# Patient Record
Sex: Female | Born: 1955 | Race: Black or African American | Hispanic: No | Smoking: Never smoker
Health system: Southern US, Community
[De-identification: ages and names within clinical notes are randomized; demographics above are authoritative.]

## PROBLEM LIST (undated history)

## (undated) DIAGNOSIS — J309 Allergic rhinitis, unspecified: Secondary | ICD-10-CM

## (undated) DIAGNOSIS — Z9109 Other allergy status, other than to drugs and biological substances: Secondary | ICD-10-CM

## (undated) DIAGNOSIS — K219 Gastro-esophageal reflux disease without esophagitis: Secondary | ICD-10-CM

## (undated) HISTORY — DX: Allergic rhinitis, unspecified: J30.9

## (undated) HISTORY — PX: ABDOMINAL SURGERY: SHX537

## (undated) HISTORY — PX: DILATION AND CURETTAGE OF UTERUS: SHX78

## (undated) HISTORY — PX: HEMORROIDECTOMY: SUR656

## (undated) HISTORY — DX: Gastro-esophageal reflux disease without esophagitis: K21.9

---

## 1997-08-13 ENCOUNTER — Other Ambulatory Visit: Admission: RE | Admit: 1997-08-13 | Discharge: 1997-08-13 | Payer: Self-pay | Admitting: Gynecology

## 1997-12-25 ENCOUNTER — Other Ambulatory Visit: Admission: RE | Admit: 1997-12-25 | Discharge: 1997-12-25 | Payer: Self-pay | Admitting: Obstetrics and Gynecology

## 1998-02-17 ENCOUNTER — Ambulatory Visit (HOSPITAL_COMMUNITY): Admission: RE | Admit: 1998-02-17 | Discharge: 1998-02-17 | Payer: Self-pay | Admitting: Obstetrics and Gynecology

## 1998-04-27 ENCOUNTER — Ambulatory Visit (HOSPITAL_COMMUNITY): Admission: RE | Admit: 1998-04-27 | Discharge: 1998-04-27 | Payer: Self-pay | Admitting: Obstetrics and Gynecology

## 1998-04-30 ENCOUNTER — Ambulatory Visit (HOSPITAL_COMMUNITY): Admission: RE | Admit: 1998-04-30 | Discharge: 1998-04-30 | Payer: Self-pay | Admitting: Obstetrics and Gynecology

## 1998-04-30 ENCOUNTER — Encounter: Payer: Self-pay | Admitting: Obstetrics and Gynecology

## 1998-05-01 ENCOUNTER — Encounter: Payer: Self-pay | Admitting: Obstetrics and Gynecology

## 1998-05-01 ENCOUNTER — Inpatient Hospital Stay (HOSPITAL_COMMUNITY): Admission: AD | Admit: 1998-05-01 | Discharge: 1998-05-13 | Payer: Self-pay | Admitting: Obstetrics and Gynecology

## 1998-05-07 ENCOUNTER — Encounter: Payer: Self-pay | Admitting: Obstetrics and Gynecology

## 1998-05-12 ENCOUNTER — Encounter (HOSPITAL_COMMUNITY): Admission: RE | Admit: 1998-05-12 | Discharge: 1998-08-10 | Payer: Self-pay | Admitting: Obstetrics and Gynecology

## 1998-08-13 ENCOUNTER — Encounter (HOSPITAL_COMMUNITY): Admission: RE | Admit: 1998-08-13 | Discharge: 1998-11-11 | Payer: Self-pay | Admitting: *Deleted

## 1999-03-02 ENCOUNTER — Other Ambulatory Visit: Admission: RE | Admit: 1999-03-02 | Discharge: 1999-03-02 | Payer: Self-pay | Admitting: Obstetrics and Gynecology

## 2000-02-28 ENCOUNTER — Encounter: Admission: RE | Admit: 2000-02-28 | Discharge: 2000-02-28 | Payer: Self-pay | Admitting: Gynecology

## 2000-02-28 ENCOUNTER — Encounter: Payer: Self-pay | Admitting: Gynecology

## 2000-09-13 ENCOUNTER — Other Ambulatory Visit: Admission: RE | Admit: 2000-09-13 | Discharge: 2000-09-13 | Payer: Self-pay | Admitting: Obstetrics and Gynecology

## 2000-09-18 ENCOUNTER — Encounter: Payer: Self-pay | Admitting: Obstetrics and Gynecology

## 2000-09-18 ENCOUNTER — Ambulatory Visit (HOSPITAL_COMMUNITY): Admission: RE | Admit: 2000-09-18 | Discharge: 2000-09-18 | Payer: Self-pay | Admitting: Obstetrics and Gynecology

## 2000-10-08 ENCOUNTER — Encounter (INDEPENDENT_AMBULATORY_CARE_PROVIDER_SITE_OTHER): Payer: Self-pay | Admitting: Specialist

## 2000-10-08 ENCOUNTER — Ambulatory Visit (HOSPITAL_COMMUNITY): Admission: RE | Admit: 2000-10-08 | Discharge: 2000-10-08 | Payer: Self-pay | Admitting: Obstetrics and Gynecology

## 2000-12-20 ENCOUNTER — Encounter: Admission: RE | Admit: 2000-12-20 | Discharge: 2000-12-20 | Payer: Self-pay | Admitting: Obstetrics and Gynecology

## 2000-12-20 ENCOUNTER — Encounter: Payer: Self-pay | Admitting: Obstetrics and Gynecology

## 2001-01-23 ENCOUNTER — Encounter: Admission: RE | Admit: 2001-01-23 | Discharge: 2001-01-23 | Payer: Self-pay | Admitting: Family Medicine

## 2001-01-23 ENCOUNTER — Encounter: Payer: Self-pay | Admitting: Family Medicine

## 2001-12-23 ENCOUNTER — Encounter: Admission: RE | Admit: 2001-12-23 | Discharge: 2001-12-23 | Payer: Self-pay | Admitting: Obstetrics and Gynecology

## 2001-12-23 ENCOUNTER — Encounter: Payer: Self-pay | Admitting: Obstetrics and Gynecology

## 2001-12-26 ENCOUNTER — Other Ambulatory Visit: Admission: RE | Admit: 2001-12-26 | Discharge: 2001-12-26 | Payer: Self-pay | Admitting: Obstetrics and Gynecology

## 2002-10-03 ENCOUNTER — Encounter: Admission: RE | Admit: 2002-10-03 | Discharge: 2002-11-24 | Payer: Self-pay | Admitting: Family Medicine

## 2002-10-30 ENCOUNTER — Encounter: Payer: Self-pay | Admitting: Family Medicine

## 2002-10-30 ENCOUNTER — Encounter: Admission: RE | Admit: 2002-10-30 | Discharge: 2002-10-30 | Payer: Self-pay | Admitting: Family Medicine

## 2002-12-25 ENCOUNTER — Encounter: Admission: RE | Admit: 2002-12-25 | Discharge: 2002-12-25 | Payer: Self-pay | Admitting: Obstetrics and Gynecology

## 2002-12-25 ENCOUNTER — Encounter: Payer: Self-pay | Admitting: Obstetrics and Gynecology

## 2002-12-29 ENCOUNTER — Other Ambulatory Visit: Admission: RE | Admit: 2002-12-29 | Discharge: 2002-12-29 | Payer: Self-pay | Admitting: Obstetrics and Gynecology

## 2004-01-01 ENCOUNTER — Encounter: Admission: RE | Admit: 2004-01-01 | Discharge: 2004-01-01 | Payer: Self-pay | Admitting: Obstetrics and Gynecology

## 2005-06-14 ENCOUNTER — Other Ambulatory Visit: Admission: RE | Admit: 2005-06-14 | Discharge: 2005-06-14 | Payer: Self-pay | Admitting: Obstetrics and Gynecology

## 2005-08-11 ENCOUNTER — Ambulatory Visit: Payer: Self-pay | Admitting: Family Medicine

## 2005-09-06 ENCOUNTER — Encounter: Admission: RE | Admit: 2005-09-06 | Discharge: 2005-09-06 | Payer: Self-pay | Admitting: Obstetrics and Gynecology

## 2007-03-13 DIAGNOSIS — J309 Allergic rhinitis, unspecified: Secondary | ICD-10-CM

## 2007-03-13 DIAGNOSIS — K219 Gastro-esophageal reflux disease without esophagitis: Secondary | ICD-10-CM

## 2007-03-13 HISTORY — DX: Allergic rhinitis, unspecified: J30.9

## 2007-03-13 HISTORY — DX: Gastro-esophageal reflux disease without esophagitis: K21.9

## 2008-06-12 ENCOUNTER — Encounter: Admission: RE | Admit: 2008-06-12 | Discharge: 2008-06-12 | Payer: Self-pay | Admitting: Obstetrics and Gynecology

## 2008-12-29 ENCOUNTER — Telehealth: Payer: Self-pay | Admitting: Family Medicine

## 2009-06-15 ENCOUNTER — Encounter: Admission: RE | Admit: 2009-06-15 | Discharge: 2009-06-15 | Payer: Self-pay | Admitting: Family Medicine

## 2009-11-10 ENCOUNTER — Ambulatory Visit: Payer: Self-pay | Admitting: Family Medicine

## 2009-11-10 LAB — CONVERTED CEMR LAB
Bilirubin Urine: NEGATIVE
Blood in Urine, dipstick: NEGATIVE
Ketones, urine, test strip: NEGATIVE
Protein, U semiquant: NEGATIVE
Urobilinogen, UA: 0.2

## 2009-11-12 ENCOUNTER — Encounter (INDEPENDENT_AMBULATORY_CARE_PROVIDER_SITE_OTHER): Payer: Self-pay | Admitting: *Deleted

## 2009-11-15 ENCOUNTER — Ambulatory Visit: Payer: Self-pay | Admitting: Internal Medicine

## 2009-11-16 LAB — CONVERTED CEMR LAB
BUN: 17 mg/dL (ref 6–23)
Bilirubin, Direct: 0.2 mg/dL (ref 0.0–0.3)
Chloride: 111 meq/L (ref 96–112)
Cholesterol: 159 mg/dL (ref 0–200)
Creatinine, Ser: 0.7 mg/dL (ref 0.4–1.2)
Eosinophils Absolute: 0.1 10*3/uL (ref 0.0–0.7)
Eosinophils Relative: 2 % (ref 0.0–5.0)
HDL: 70.6 mg/dL (ref >39.00)
LDL Cholesterol: 84 mg/dL (ref 0–99)
MCHC: 34.1 g/dL (ref 30.0–36.0)
MCV: 95.9 fL (ref 78.0–100.0)
Monocytes Absolute: 0.5 10*3/uL (ref 0.1–1.0)
Neutrophils Relative %: 55.6 % (ref 43.0–77.0)
Platelets: 202 10*3/uL (ref 150.0–400.0)
Total Bilirubin: 0.8 mg/dL (ref 0.3–1.2)
Triglycerides: 23 mg/dL (ref 0.0–149.0)
WBC: 4.4 10*3/uL — ABNORMAL LOW (ref 4.5–10.5)

## 2009-11-26 ENCOUNTER — Ambulatory Visit: Payer: Self-pay | Admitting: Internal Medicine

## 2009-11-26 LAB — HM COLONOSCOPY

## 2010-04-27 ENCOUNTER — Ambulatory Visit: Payer: Self-pay | Admitting: Family Medicine

## 2010-06-17 ENCOUNTER — Encounter
Admission: RE | Admit: 2010-06-17 | Discharge: 2010-06-17 | Payer: Self-pay | Source: Home / Self Care | Attending: Obstetrics and Gynecology | Admitting: Obstetrics and Gynecology

## 2010-06-17 LAB — HM MAMMOGRAPHY: HM Mammogram: NEGATIVE

## 2010-08-09 NOTE — Letter (Signed)
Summary: [facility] Instructions  [facility]  [address] [place], [place] [address]   Phone: [phone]  Fax: [phone]       Tabitha Cherry    February 21, 1956    MRN: [id number]       Procedure Day /Date: Friday 11/26/2009     Arrival Time:  9:30 am     Procedure Time: 10:30 am     Location of Procedure:                    _x _  [facility] (4th Floor)    PREPARATION FOR COLONOSCOPY WITH MIRALAX  Starting 5 days prior to your procedure Sunday 5/15 do not eat nuts, seeds, popcorn, corn, beans, peas,  salads, or any raw vegetables.  Do not take any fiber supplements (e.g. Metamucil, Citrucel, and Benefiber). ____________________________________________________________________________________________________   THE DAY BEFORE YOUR PROCEDURE         DATE: Thursday 5/19  1   Drink clear liquids the entire day-NO SOLID FOOD  2   Do not drink anything colored red or purple.  Avoid juices with pulp.  No orange juice.  3   Drink at least 64 oz. (8 glasses) of fluid/clear liquids during the day to prevent dehydration and help the prep work efficiently.  CLEAR LIQUIDS INCLUDE: Water Jello Ice Popsicles Tea (sugar ok, no milk/cream) Powdered fruit flavored drinks Coffee (sugar ok, no milk/cream) Gatorade Juice: apple, white grape, white cranberry  Lemonade Clear bullion, consomm, broth Carbonated beverages (any kind) Strained chicken noodle soup Hard Candy  4   Mix the entire bottle of Miralax with 64 oz. of Gatorade/Powerade in the morning and put in the refrigerator to chill.  5   At 3:00 pm take 2 Dulcolax/Bisacodyl tablets.  6   At 4:30 pm take one Reglan/Metoclopramide tablet.  7  Starting at 5:00 pm drink one 8 oz glass of the Miralax mixture every 15-20 minutes until you have finished drinking the entire 64 oz.  You should finish drinking prep around 7:30 or 8:00 pm.  8   If you are nauseated, you may take the 2nd  Reglan/Metoclopramide tablet at 6:30 pm.        9    At 8:00 pm take 2 more DULCOLAX/Bisacodyl tablets.     THE DAY OF YOUR PROCEDURE      DATE:  Friday 5/20  You may drink clear liquids until 8:30 am  (2 HOURS BEFORE PROCEDURE).   MEDICATION INSTRUCTIONS  Unless otherwise instructed, you should take regular prescription medications with a small sip of water as early as possible the morning of your procedure.           OTHER INSTRUCTIONS  You will need a responsible adult at least 54 years of age to accompany you and drive you home.   This person must remain in the waiting room during your procedure.  Wear loose fitting clothing that is easily removed.  Leave jewelry and other valuables at home.  However, you may wish to bring a book to read or an iPod/MP3 player to listen to music as you wait for your procedure to start.  Remove all body piercing jewelry and leave at home.  Total time from sign-in until discharge is approximately 2-3 hours.  You should go home directly after your procedure and rest.  You can resume normal activities the day after your procedure.  The day of your procedure you should not:   Drive  Make legal decisions   Operate machinery   Drink alcohol   Return to work  You will receive specific instructions about eating, activities and medications before you leave.   The above instructions have been reviewed and explained to me by   Ezra Sites RN  Nov 15, 2009 4:26 PM     I fully understand and can verbalize these instructions _____________________________ Date _______

## 2010-08-09 NOTE — Procedures (Signed)
Summary: Colonoscopy  Patient: [name] Note: All result statuses are Final unless otherwise noted.  Tests: (1) Colonoscopy (COL)   COL Colonoscopy           DONE     [facility]     [address]     [place], [place]  [address]           COLONOSCOPY PROCEDURE REPORT           PATIENT:  Tabitha Cherry, Tabitha Cherry  MR#:  [id number]     BIRTHDATE:  1956-05-24, 54 yrs. old  GENDER:  female     ENDOSCOPIST:  Hedwig Morton. Juanda Chance, MD     REF. BY:  Tinnie Gens A. Tawanna Cooler, M.D.     PROCEDURE DATE:  11/26/2009     PROCEDURE:  Colonoscopy 19147     ASA CLASS:  Class I     INDICATIONS:  Routine Risk Screening     MEDICATIONS:   Versed 10 mg, Fentanyl 100 mcg           DESCRIPTION OF PROCEDURE:   After the risks benefits and     alternatives of the procedure were thoroughly explained, informed     consent was obtained.  Digital rectal exam was performed and     revealed no rectal masses.   The [id number] endoscope was     introduced through the anus and advanced to the cecum, which was     identified by both the appendix and ileocecal valve, without     limitations.  The quality of the prep was good, using MiraLax.     The instrument was then slowly withdrawn as the colon was fully     examined.     <<PROCEDUREIMAGES>>     FINDINGS:  Internal hemorrhoids were found (see image7, image6,     and image5).  This was otherwise a normal examination of the colon     (see image4, image3, image2, and image1).   Retroflexed views in     the rectum revealed no abnormalities.    The scope was the     n withdrawn from the patient and the procedure completed.           COMPLICATIONS:  None     ENDOSCOPIC IMPRESSION:     1) Internal hemorrhoids     2) Otherwise normal examination     RECOMMENDATIONS:     1) high fiber diet     REPEAT EXAM:  In 10 year(s) for.           ______________________________     Hedwig Morton. Juanda Chance, MD           CC:           n.     eSIGNED:   Hedwig Morton. Evett Kassa  at 11/26/2009 10:49 AM           Yevonne Pax, [phone]  Note: An exclamation mark (!) indicates a result that was not dispersed into the flowsheet. Document Creation Date: 11/26/2009 1:53 PM _______________________________________________________________________  (1) Order result status: Final Collection or observation date-time: 11/26/2009 10:43 Requested date-time:  Receipt date-time:  Reported date-time:  Referring Physician:   Ordering Physician: Lina Sar [phone]) Specimen Source:  Source: Launa Grill Order Number: [id number] Lab site:   Appended Document: Colonoscopy    Clinical Lists Changes  Observations: Added new observation of COLONNXTDUE: 11/2019 (11/26/2009 11:59)

## 2010-08-09 NOTE — Assessment & Plan Note (Signed)
Summary: to be re-est/pt will come in fasting/njr   Vital Signs:  Patient profile:   55 year old female Menstrual status:  postmenopausal Height:      64 inches Weight:      150 pounds BMI:     25.84 Temp:     98.1 degrees F oral BP sitting:   118 / 80  (left arm) Cuff size:   regular  Vitals Entered By: Kern Reap CMA Duncan Dull) (Nov 10, 2009 9:25 AM) CC: new to establish Is Patient Diabetic? No Pain Assessment Patient in pain? no          Menstrual Status postmenopausal   CC:  new to establish.  History of Present Illness: Tabitha Cherry is a 55 year old, married female, nonsmoker, G2, P1..........Marland Kitchen 40 year old daughter....... who comes in today as a new patient for physical examination  She has always been in excellent health.  She showed no chronic health problems.  Her LMP was two to 3 years ago.  She gets routine eye care.  Dental care.  Tetanus booster 2000, seasonal flu shot 2010 Pap 4 months ago by GYN.  Normal.  Preventive Screening-Counseling & Management  Alcohol-Tobacco     Smoking Status: never  Hep-HIV-STD-Contraception     Dental Visit-last 6 months yes      Drug Use:  no.    Allergies (verified): No Known Drug Allergies  Past History:  Past medical, surgical, family and social histories (including risk factors) reviewed, and no changes noted (except as noted below).  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 Denies surgical history  Family History: Reviewed history and no changes required. Father: deceased - lung cancer Mother: lymphedema Siblings: 3 brothers               1 glucoma               2 sisters               1 DM I               1 scardosis Children: 1 girl - healthy  Social History: Reviewed history from 03/13/2007 and no changes required. Occupation: claims control Married Never  Smoked Alcohol use-no Drug use-no Smoking Status:  never Drug Use:  no Dental Care w/in 6 mos.:  yes  Review of Systems      See HPI  Physical Exam  General:  Well-developed,well-nourished,in no acute distress; alert,appropriate and cooperative throughout examination Head:  Normocephalic and atraumatic without obvious abnormalities. No apparent alopecia or balding. Eyes:  No corneal or conjunctival inflammation noted. EOMI. Perrla. Funduscopic exam benign, without hemorrhages, exudates or papilledema. Vision grossly normal. Ears:  External ear exam shows no significant lesions or deformities.  Otoscopic examination reveals clear canals, tympanic membranes are intact bilaterally without bulging, retraction, inflammation or discharge. Hearing is grossly normal bilaterally. Nose:  External nasal examination shows no deformity or inflammation. Nasal mucosa are pink and moist without lesions or exudates. Mouth:  Oral mucosa and oropharynx without lesions or exudates.  Teeth in good repair. Neck:  No deformities, masses, or tenderness noted. Chest Wall:  No deformities, masses, or tenderness noted. Breasts:  No mass, nodules, thickening, tenderness, bulging, retraction, inflamation, nipple discharge or skin changes noted.   Lungs:  Normal respiratory effort, chest expands symmetrically. Lungs are clear to auscultation, no crackles or wheezes. Heart:  Normal rate  and regular rhythm. S1 and S2 normal without gallop, murmur, click, rub or other extra sounds. Abdomen:  Bowel sounds positive,abdomen soft and non-tender without masses, organomegaly or hernias noted. Msk:  No deformity or scoliosis noted of thoracic or lumbar spine.   Pulses:  R and L carotid,radial,femoral,dorsalis pedis and posterior tibial pulses are full and equal bilaterally Extremities:  No clubbing, cyanosis, edema, or deformity noted with normal full range of motion of all joints.   Neurologic:  No cranial nerve deficits noted.  Station and gait are normal. Plantar reflexes are down-going bilaterally. DTRs are symmetrical throughout. Sensory, motor and coordinative functions appear intact. Skin:  Intact without suspicious lesions or rashes Cervical Nodes:  No lymphadenopathy noted Axillary Nodes:  No palpable lymphadenopathy Inguinal Nodes:  No significant adenopathy Psych:  Cognition and judgment appear intact. Alert and cooperative with normal attention span and concentration. No apparent delusions, illusions, hallucinations   Impression & Recommendations:  Problem # 1:  Preventive Health Care (ICD-V70.0) Assessment New  Orders: Venipuncture (88416) TLB-Lipid Panel (80061-LIPID) TLB-BMP (Basic Metabolic Panel-BMET) (80048-METABOL) TLB-CBC Platelet - w/Differential (85025-CBCD) TLB-Hepatic/Liver Function Pnl (80076-HEPATIC) TLB-TSH (Thyroid Stimulating Hormone) (84443-TSH) UA Dipstick w/o Micro (automated)  (81003) EKG w/ Interpretation (93000)  Complete Medication List: 1)  Transderm-scop 1.5 Mg Pt72 (Scopolamine base) .... Apply 1 patch as needed travel sickness  Other Orders: Gastroenterology Referral (GI)  Patient Instructions: 1)  Please schedule a follow-up appointment in 1 year. 2)  It is important that you exercise regularly at least 20 minutes 5 times a week. If you develop chest pain, have severe difficulty breathing, or feel very tired , stop exercising immediately and seek medical attention. 3)  Schedule your mammogram. 4)  Schedule a colonoscopy/sigmoidoscopy to help detect colon cancer. 5)  Take calcium +Vitamin D daily. 6)  Take an Aspirin every day.   Immunization History:  Tetanus/Td Immunization History:    Tetanus/Td:  historical (07/10/2006)   Laboratory Results   Urine Tests    Routine Urinalysis   Color: yellow Appearance: Clear Glucose: negative   (Normal Range: Negative) Bilirubin: negative   (Normal Range: Negative) Ketone: negative   (Normal Range:  Negative) Spec. Gravity: >=1.030   (Normal Range: 1.003-1.035) Blood: negative   (Normal Range: Negative) pH: 6.0   (Normal Range: 5.0-8.0) Protein: negative   (Normal Range: Negative) Urobilinogen: 0.2   (Normal Range: 0-1) Nitrite: negative   (Normal Range: Negative) Leukocyte Esterace: trace   (Normal Range: Negative)    Comments: Tabitha Cherry  Nov 10, 2009 11:04 AM

## 2010-08-09 NOTE — Assessment & Plan Note (Signed)
Summary: HEADACHE (HTN CONCERNS?) // RS   Vital Signs:  Patient profile:   55 year old female Menstrual status:  postmenopausal Weight:      150 pounds Temp:     98.7 degrees F oral BP sitting:   118 / 72  (left arm) Cuff size:   regular  Vitals Entered By: Sid Falcon LPN (April 27, 2010 3:57 PM)  History of Present Illness: Two-week history of headache. Mostly dull headache diffuse occipital. Moderate severity 8/10 at worse. Advil helps. Usually occurs early morning and progresses through the day. She does a lot of computer work. She has not had any nausea, vomiting, sinus symptoms, fever, or any focal neurologic symptoms. No clear exacerbating features.  Has had generally less sleep recently and decreased fluid intake. No recent head trauma. No prior history of migraine headaches. No photophobia. No worsening with activity.  Headache HPI:      The location of the headaches are bilateral and occipital.  Headache quality is pressure or tightness.        The patient denies first or worst H/A of life, change in severity from prior H/A's, change in features from prior H/A's, mylagia, fever, malaise, weight loss, focal neurologic symptoms, confusion, seizures, and impaired level of consciousness.        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    Physical Exam  General:  Well-developed,well-nourished,in no acute distress; alert,appropriate and cooperative throughout examination Head:  Normocephalic and atraumatic without obvious abnormalities. No apparent alopecia or balding. Eyes:  No corneal or conjunctival  inflammation noted. EOMI. Perrla. Funduscopic exam benign, without hemorrhages, exudates or papilledema. Vision grossly normal. Ears:  External ear exam shows no significant lesions or deformities.  Otoscopic examination reveals clear canals, tympanic membranes are intact bilaterally without bulging, retraction, inflammation or discharge. Hearing is grossly normal bilaterally. Mouth:  Oral mucosa and oropharynx without lesions or exudates.  Teeth in good repair. Neck:  No deformities, masses, or tenderness noted. Lungs:  Normal respiratory effort, chest expands symmetrically. Lungs are clear to auscultation, no crackles or wheezes. Heart:  Normal rate and regular rhythm. S1 and S2 normal without gallop, murmur, click, rub or other extra sounds. Neurologic:  alert & oriented X3, cranial nerves II-XII intact, strength normal in all extremities, and DTRs symmetrical and normal.   Skin:  no rashes.   Cervical Nodes:  No lymphadenopathy noted Psych:  normally interactive, good eye contact, not anxious appearing, and not depressed appearing.     Impression & Recommendations:  Problem # 1:  TENSION HEADACHE (ICD-307.81) Assessment New Conservative measures discussed.  Try moist heat, topical rubs, muscle massage,  low dose cyclobenzaprine at night. Increase sleep and aerobic exercise.  Complete Medication List: 1)  Transderm-scop 1.5 Mg Pt72 (Scopolamine base) .... Apply 1 patch as needed travel sickness 2)  Cyclobenzaprine Hcl 5 Mg Tabs (Cyclobenzaprine hcl) .... One by mouth q hs as needed headache and muscle tension  Patient Instructions: 1)  apply moist heat to back of neck area several times daily 2)  Consider topical rub and muscle massage 3)  Touch base with primary physician if no better in one week Prescriptions: CYCLOBENZAPRINE HCL 5 MG TABS (CYCLOBENZAPRINE  HCL) one by mouth q hs as needed headache and muscle tension  #15 x 0   Entered and Authorized by:   Evelena Peat MD    Signed by:   Evelena Peat MD on 04/27/2010   Method used:   Electronically to        [facility] [address] [phone]* (retail)       [address]       [place], [place]  [address]       Ph: [phone] or [phone]       Fax: [phone]   RxID:   [id number]    Orders Added: 1)  Est. Patient Level IV [07371]

## 2010-08-09 NOTE — Miscellaneous (Signed)
Summary: [facility]  Clinical Lists Changes  Medications: Added new medication of MIRALAX   POWD (POLYETHYLENE GLYCOL 3350) As per prep  instructions. - Signed Added new medication of REGLAN 10 MG  TABS (METOCLOPRAMIDE HCL) As per prep instructions. - Signed Added new medication of DULCOLAX 5 MG  TBEC (BISACODYL) Day before procedure take 2 at 3pm and 2 at 8pm. - Signed Rx of MIRALAX   POWD (POLYETHYLENE GLYCOL 3350) As per prep  instructions.;  #255gm x 0;  Signed;  Entered by: Ezra Sites RN;  Authorized by: Hart Carwin MD;  Method used: Electronically to [facility] [address] [phone]*, [address], [place], [place], [place]  [address], Ph: [phone] or [phone], Fax: [phone] Rx of REGLAN 10 MG  TABS (METOCLOPRAMIDE HCL) As per prep instructions.;  #2 x 0;  Signed;  Entered by: Ezra Sites RN;  Authorized by: Hart Carwin MD;  Method used: Electronically to [facility] [address] [phone]*, [address], [place], [place], [place]  [address], Ph: [phone] or [phone], Fax: [phone] Rx of DULCOLAX 5 MG  TBEC (BISACODYL) Day before procedure take 2 at 3pm and 2 at 8pm.;  #4 x 0;  Signed;  Entered by: Ezra Sites RN;  Authorized by: Hart Carwin MD;  Method used: Electronically to [facility] [address] [place] [phone]*, [address], [place], [place], [place]  [address], Ph: [phone] or [phone], Fax: [phone] Observations: Added new observation of NKA: T (11/15/2009 15:58)    Prescriptions: DULCOLAX 5 MG  TBEC (BISACODYL) Day before procedure take 2 at 3pm and 2 at 8pm.  #4 x 0   Entered by:   Ezra Sites RN   Authorized by:   Hart Carwin MD   Signed by:   Ezra Sites RN on 11/15/2009   Method used:   Electronically to        [facility] [address] [phone]* (retail)       [address]       [place], [place]  [address]       Ph: [phone] or [phone]       Fax: [phone]   RxID:   [id number] REGLAN 10 MG  TABS (METOCLOPRAMIDE  HCL) As per prep instructions.  #2 x 0   Entered by:   Ezra Sites RN   Authorized by:   Hart Carwin MD   Signed by:   Ezra Sites RN on 11/15/2009   Method used:   Electronically to        [facility]. [address] [phone]* (retail)       [address]       [place], [place]  [address]       Ph: [phone] or [phone]       Fax: [phone]   RxID:   [id number] MIRALAX   POWD (POLYETHYLENE GLYCOL 3350) As per prep  instructions.  #255gm x 0   Entered by:   Ezra Sites RN   Authorized by:   Hart Carwin MD   Signed by:   Ezra Sites RN on 11/15/2009   Method used:   Electronically to        [facility]. [address] [phone]* (retail)       [address]       [place]  [place], [place]  [address]       Ph: [phone] or [phone]       Fax: [phone]   RxID:   [id number]

## 2010-11-25 NOTE — Op Note (Signed)
[facility]  Patient:    Tabitha Cherry, Tabitha Cherry            MRN: [id number] Proc. Date: 10/08/00 Adm. Date:  [id number] Attending:  Michaele Offer                           Operative Report  PREOPERATIVE DIAGNOSIS:  Irregular menses and leiomyomatous uterus.  POSTOPERATIVE DIAGNOSIS:  Irregular menses and leiomyomatous uterus.  PROCEDURE:  Hysteroscopy with dilatation and curettage.  SURGEON:  Zenaida Niece, M.D.  ANESTHESIA:  Monitored anesthesia care and paracervical block.  ESTIMATED BLOOD LOSS:  Less than 50 cc.  FLUID DEFICIT THROUGH THE HYSTEROSCOPE:  Zero.  FINDINGS:  Normal size uterine cavity that was slightly elongated to the patients right and there were no significant endometrial lesions seen.  COUNTS:  Correct.  CONDITION:  Stable.  DESCRIPTION OF PROCEDURE:  After appropriate informed consent was obtained, the patient was taken to the operating room and placed in the dorsal supine position.  She was given IV sedation and placed in mobile stirrups.  Her peritoneum and vagina were then prepped and draped in the usual sterile fashion and her bladder drained with a red rubber catheter.  A bivalve speculum was inserted into the vagina and the anterior lip of the cervix grasped with a single tooth tenaculum.  Paracervical block was then performed with 1% lidocaine.  The bivalve speculum was removed and a weighted-speculum inserted.  The cervix was then gradually dilated to a size 23 dilator with some initial resistance.  The ________ hysteroscope was inserted, again initially with some difficulty.  Upon observation of the endometrial cavity, at first I thought there might be a small septum.  However, I was not able to demonstrate this on future looks.  There was noted to be a fair amount of shaggy endometrium and the hysteroscope was removed.  Curettage was performed with return of minimal tissue and polyp forceps were  also used and removed minimal tissue.  The hysteroscope was again inserted and most of the endometrium was still there.  There were also a few small clots.  Thorough inspection of the endometrial cavity revealed a normal left tubal ostia, but the right tubal ostia was not able to be demonstrated.  Again, the endometrial cavity did appear to be slightly elongated to the patients right.  No significant submucosal myomas or endometrial polyps were seen.  The hysteroscope was removed and curettage was performed one more time, again with return of minimal tissue, and no tissue was able to be removed with the polyp forceps.  The single tooth tenaculum was removed and bleeding controlled with pressure. The patient was awakened in the operating room, and tolerated the procedure well, and taken to the recovery room in stable condition. DD:  10/08/00 TD:  10/09/00 Job: [id number]

## 2011-07-07 ENCOUNTER — Other Ambulatory Visit: Payer: Self-pay | Admitting: Obstetrics and Gynecology

## 2011-07-07 DIAGNOSIS — Z1231 Encounter for screening mammogram for malignant neoplasm of breast: Secondary | ICD-10-CM

## 2011-07-12 ENCOUNTER — Ambulatory Visit (INDEPENDENT_AMBULATORY_CARE_PROVIDER_SITE_OTHER): Payer: 59 | Admitting: Family

## 2011-07-12 ENCOUNTER — Encounter: Payer: Self-pay | Admitting: Family

## 2011-07-12 VITALS — BP 130/88 | HR 103 | Temp 99.0°F | Ht 64.0 in | Wt 157.0 lb

## 2011-07-12 DIAGNOSIS — J069 Acute upper respiratory infection, unspecified: Secondary | ICD-10-CM

## 2011-07-12 DIAGNOSIS — J019 Acute sinusitis, unspecified: Secondary | ICD-10-CM

## 2011-07-12 DIAGNOSIS — R0982 Postnasal drip: Secondary | ICD-10-CM

## 2011-07-12 DIAGNOSIS — J040 Acute laryngitis: Secondary | ICD-10-CM

## 2011-07-12 MED ORDER — FEXOFENADINE-PSEUDOEPHED ER 180-240 MG PO TB24: 1.0000 | ORAL_TABLET | Freq: Every day | ORAL | Status: DC

## 2011-07-12 MED ORDER — AMOXICILLIN 500 MG PO TABS: 1000.0000 mg | ORAL_TABLET | Freq: Two times a day (BID) | ORAL | Status: AC

## 2011-07-12 MED ORDER — GUAIFENESIN-CODEINE 100-10 MG/5ML PO SYRP: 5.0000 mL | ORAL_SOLUTION | Freq: Three times a day (TID) | ORAL | Status: AC | PRN

## 2011-07-12 NOTE — Patient Instructions (Signed)

## 2011-07-12 NOTE — Progress Notes (Signed)
  Subjective:    Patient ID: Tabitha Cherry, female    DOB: 02/13/1956, 56 y.o.   MRN: [id number]  HPI 56 year old black female, nonsmoker, patient of Dr. Tawanna Cooler is here with a ten-day history of cough, congestion, fatigue, hoarseness, and postnasal drip that is worsening. She can take an over-the-counter Mucinex, Delsym, and Advil with little to no relief. Denies any shortness of breath, chest pain, nausea, vomiting.   Review of Systems  Constitutional: Positive for fatigue.  HENT: Positive for congestion, sneezing, postnasal drip and sinus pressure.   Eyes: Positive for itching.  Respiratory: Positive for cough.   Cardiovascular: Negative.   Gastrointestinal: Negative.   Genitourinary: Negative.   Musculoskeletal: Negative.   Skin: Negative.   Neurological: Negative.   Hematological: Negative.   Psychiatric/Behavioral: Negative.    No past medical history on file.  History   Social History  . Marital Status: Married    Spouse Name: N/A    Number of Children: N/A  . Years of Education: N/A   Occupational History  . Not on file.   Social History Main Topics  . Smoking status: Never Smoker   . Smokeless tobacco: Not on file  . Alcohol Use: No  . Drug Use: No  . Sexually Active: Not on file   Other Topics Concern  . Not on file   Social History Narrative  . No narrative on file    No past surgical history on file.  No family history on file.  Allergies  Allergen Reactions  . Sulfonamide Derivatives     REACTION: fever    No current outpatient prescriptions on file prior to visit.    BP 130/88  Pulse 103  Temp(Src) 99 F (37.2 C) (Oral)  Ht 5\' 4"  (1.626 m)  Wt 157 lb (71.215 kg)  BMI 26.95 kg/m2chart   Objective:   Physical Exam  Constitutional: She is oriented to person, place, and time. She appears well-developed and well-nourished.  HENT:  Right Ear: External ear normal.  Left Ear: External ear normal.  Nose: Nose normal.    Mouth/Throat: Oropharynx is clear and moist.  Eyes: Conjunctivae are normal. Pupils are equal, round, and reactive to light.  Neck: Normal range of motion. Neck supple.  Cardiovascular: Normal rate, regular rhythm and normal heart sounds.   Pulmonary/Chest: Effort normal and breath sounds normal.  Musculoskeletal: Normal range of motion.  Neurological: She is alert and oriented to person, place, and time.  Skin: Skin is warm and dry.  Psychiatric: She has a normal mood and affect.          Assessment & Plan:  Assessment: Upper respiratory infection  Plan: Augmentin 875 one by mouth twice a day x10 days. Allegra-D 24 hours 1 by mouth daily. Robitussin a.c. 1 teaspoon every 6-8 hours when necessary cough. Warned of drowsiness. Do not drive. Patient to call if symptoms worsen or persist. Recheck as scheduled and when necessary.

## 2011-07-27 ENCOUNTER — Ambulatory Visit
Admission: RE | Admit: 2011-07-27 | Discharge: 2011-07-27 | Disposition: A | Discharge status: Home / Self Care | Payer: 59 | Source: Ambulatory Visit | Attending: Obstetrics and Gynecology | Admitting: Obstetrics and Gynecology

## 2011-07-27 DIAGNOSIS — Z1231 Encounter for screening mammogram for malignant neoplasm of breast: Secondary | ICD-10-CM

## 2011-07-28 ENCOUNTER — Encounter: Payer: Self-pay | Admitting: Family Medicine

## 2011-07-28 ENCOUNTER — Ambulatory Visit: Payer: 59 | Admitting: Internal Medicine

## 2011-07-28 ENCOUNTER — Ambulatory Visit (INDEPENDENT_AMBULATORY_CARE_PROVIDER_SITE_OTHER): Payer: 59 | Admitting: Family Medicine

## 2011-07-28 VITALS — BP 114/86 | Temp 98.5°F | Wt 157.0 lb

## 2011-07-28 DIAGNOSIS — R05 Cough: Secondary | ICD-10-CM

## 2011-07-28 DIAGNOSIS — J209 Acute bronchitis, unspecified: Secondary | ICD-10-CM

## 2011-07-28 MED ORDER — HYDROCOD POLST-CHLORPHEN POLST 10-8 MG/5ML PO LQCR: 5.0000 mL | Freq: Two times a day (BID) | ORAL | Status: DC | PRN

## 2011-07-28 NOTE — Progress Notes (Signed)
  Subjective:    Patient ID: Tabitha Cherry, female    DOB: May 17, 1956, 56 y.o.   MRN: [id number]  HPI  Patient seen with persistent cough. Onset of illness 07/05/2011.  Patient is nonsmoker. Initially had productive cough and sinus congestion and diffuse body aches. She was treated with Augmentin and completed therapy. Productive cough did stop but now she has a lingering dry cough. Nasal congestion is improved. She denies any fever or chills. No dyspnea. No pleuritic pain. No hemoptysis. She has past history of some mild GERD but no recent GERD symptoms. No wheezing and no history of asthma. No history of smoking.  Review of Systems  Constitutional: Negative for fever, chills, appetite change and unexpected weight change.  HENT: Negative for congestion, sore throat, trouble swallowing and voice change.   Respiratory: Positive for cough. Negative for shortness of breath and wheezing.   Gastrointestinal: Negative for abdominal pain.  Neurological: Negative for headaches.       Objective:   Physical Exam  Constitutional: She appears well-developed and well-nourished.  HENT:  Right Ear: External ear normal.  Left Ear: External ear normal.  Mouth/Throat: Oropharynx is clear and moist.  Neck: Neck supple.  Cardiovascular: Normal rate and regular rhythm.   Pulmonary/Chest: Effort normal and breath sounds normal. No respiratory distress. She has no wheezes. She has no rales.  Lymphadenopathy:    She has no cervical adenopathy.          Assessment & Plan:  Persistent cough. Suspect post viral bronchitis cough. No evidence for reactive airway disease. Nonfocal exam. Pulse oximetry 99%. Lung exam clear. Tussionex 1 teaspoon every 12 hours when necessary for severe cough. Follow up promptly for any fever or new symptoms or if cough not improving by next week

## 2011-07-28 NOTE — Patient Instructions (Signed)
Follow up promptly for any fever or if cough not improved by next week.

## 2011-07-29 ENCOUNTER — Encounter (HOSPITAL_COMMUNITY): Payer: Self-pay | Admitting: *Deleted

## 2011-07-29 ENCOUNTER — Emergency Department (HOSPITAL_COMMUNITY)
Admission: EM | Admit: 2011-07-29 | Discharge: 2011-07-29 | Disposition: A | Discharge status: Home / Self Care | Payer: 59 | Source: Home / Self Care

## 2011-07-29 DIAGNOSIS — N390 Urinary tract infection, site not specified: Secondary | ICD-10-CM

## 2011-07-29 HISTORY — DX: Other allergy status, other than to drugs and biological substances: Z91.09

## 2011-07-29 LAB — POCT URINALYSIS DIP (DEVICE)
Glucose, UA: 100 mg/dL — AB
Ketones, ur: 15 mg/dL — AB

## 2011-07-29 LAB — URINE CULTURE
Colony Count: >=100000
Culture  Setup Time: 201301191729

## 2011-07-29 MED ORDER — SULFAMETHOXAZOLE-TRIMETHOPRIM 800-160 MG PO TABS: 1.0000 | ORAL_TABLET | Freq: Two times a day (BID) | ORAL | Status: DC

## 2011-07-29 MED ORDER — PHENAZOPYRIDINE HCL 200 MG PO TABS: 200.0000 mg | ORAL_TABLET | Freq: Three times a day (TID) | ORAL | Status: AC

## 2011-07-29 NOTE — ED Provider Notes (Signed)
History     CSN: [id number]  Arrival date & time 07/29/11  1153   None     Chief Complaint  Patient presents with  . Dysuria  . Urinary Frequency  . Hematuria    (Consider location/radiation/quality/duration/timing/severity/associated sxs/prior treatment) HPI Comments: Patient reports dysuria, urinary frequency, urgency, and hematuria x 2 days.  States this feels like a previous UTI.  Denies fever, abdominal pain, N/V, abnormal vaginal discharge or bleeding.  Patient is post-menopausal, LMP 2 years ago.    Patient is a 56 y.o. female presenting with dysuria, frequency, and hematuria. The history is provided by the patient.  Dysuria  Associated symptoms include frequency and hematuria. Pertinent negatives include no nausea, no vomiting and no flank pain.  Urinary Frequency Pertinent negatives include no chest pain, no abdominal pain and no shortness of breath.  Hematuria Irritative symptoms include frequency. Associated symptoms include dysuria. Pertinent negatives include no abdominal pain, fever, flank pain, nausea or vomiting.    Past Medical History  Diagnosis Date  . ALLERGIC RHINITIS 03/13/2007  . GERD 03/13/2007  . Environmental allergies     Past Surgical History  Procedure Date  . Hemorroidectomy   . Dilation and curettage of uterus   . Abdominal surgery     Family History  Problem Relation Age of Onset  . Cancer Father     lung  . Diabetes Sister     History  Substance Use Topics  . Smoking status: Never Smoker   . Smokeless tobacco: Not on file  . Alcohol Use: No    OB History    Grav Para Term Preterm Abortions TAB SAB Ect Mult Living                  Review of Systems  Constitutional: Negative for fever.  Respiratory: Negative for shortness of breath.   Cardiovascular: Negative for chest pain.  Gastrointestinal: Positive for constipation. Negative for nausea, vomiting, abdominal pain, diarrhea and abdominal distention.  Genitourinary:  Positive for dysuria, frequency and hematuria. Negative for flank pain, vaginal bleeding, vaginal discharge and vaginal pain.  All other systems reviewed and are negative.    Allergies  Review of patient's allergies indicates no known allergies.  Home Medications   Current Outpatient Rx  Name Route Sig Dispense Refill  . CALTRATE 600+D PO Oral Take by mouth.      Marland Kitchen HYDROCOD POLST-CPM POLST ER 10-8 MG/5ML PO LQCR Oral Take 5 mLs by mouth every 12 (twelve) hours as needed. 140 mL 0  . FEXOFENADINE-PSEUDOEPHED ER 180-240 MG PO TB24 Oral Take 1 tablet by mouth daily. 30 tablet 2  . AZO TABS PO Oral Take by mouth.    . ASPIRIN 81 MG PO TABS Oral Take 160 mg by mouth daily.      . CYCLOBENZAPRINE HCL 5 MG PO TABS Oral Take 5 mg by mouth at bedtime as needed.    Marland Kitchen PHENAZOPYRIDINE HCL 200 MG PO TABS Oral Take 1 tablet (200 mg total) by mouth 3 (three) times daily. 6 tablet 0  . SCOPOLAMINE BASE 1.5 MG TD PT72 Transdermal Place 1 patch onto the skin every 3 (three) days. As needed for travel sickness    . SULFAMETHOXAZOLE-TRIMETHOPRIM 800-160 MG PO TABS Oral Take 1 tablet by mouth 2 (two) times daily. 6 tablet 0    BP 125/71  Pulse 78  Temp(Src) 98.9 F (37.2 C) (Oral)  Resp 20  SpO2 99%  Physical Exam  Nursing note and vitals reviewed. Constitutional:  She is oriented to person, place, and time. She appears well-developed and well-nourished.  HENT:  Head: Normocephalic and atraumatic.  Neck: Neck supple.  Cardiovascular: Normal rate, regular rhythm and normal heart sounds.   Pulmonary/Chest: Breath sounds normal. No respiratory distress. She has no wheezes. She has no rales. She exhibits no tenderness.  Abdominal: Soft. Bowel sounds are normal. She exhibits no distension and no mass. There is no tenderness. There is no rebound, no guarding and no CVA tenderness.  Neurological: She is alert and oriented to person, place, and time.    ED Course  Procedures (including critical care  time)  Labs Reviewed  POCT URINALYSIS DIP (DEVICE) - Abnormal; Notable for the following:    Glucose, UA 100 (*)    Bilirubin Urine SMALL (*)    Ketones, ur 15 (*)    Hgb urine dipstick LARGE (*)    pH 8.5 (*)    Protein, ur >=300 (*)    Urobilinogen, UA 2.0 (*)    Nitrite POSITIVE (*)    Leukocytes, UA LARGE (*) Biochemical Testing Only. Please order routine urinalysis from main lab if confirmatory testing is needed.   All other components within normal limits  POCT URINALYSIS DIPSTICK  URINE CULTURE   No results found.   1. UTI (lower urinary tract infection)       MDM  Well-appearing patient with dysuria, frequency, urgency, hematuria with Urine dip abnormal, definite UTI.  Have sent urine for culture.  Patient is afebrile, without flank or abdominal pain or tenderness.  Doubt pyelonephritis.  Discussed reasons for immediate return.  Patient verbalizes understanding.          Rise Patience, [place] 07/29/11 1350

## 2011-07-29 NOTE — ED Notes (Signed)
Pt with onset of urinary symptoms yesterday afternoon burning with urination/frequency/and hematuria

## 2011-07-31 NOTE — ED Provider Notes (Signed)
Medical screening examination/treatment/procedure(s) were performed by non-physician practitioner and as supervising physician I was immediately available for consultation/collaboration.  Corrie Mckusick, MD 07/31/11 [phone]

## 2011-08-01 NOTE — ED Notes (Signed)
Urine Culture: >100,000 colonies E. Coli.  Pt. adequately treated with Setpra DS. Vassie Moselle 08/01/2011

## 2011-08-05 ENCOUNTER — Encounter (HOSPITAL_COMMUNITY): Payer: Self-pay | Admitting: *Deleted

## 2011-08-05 ENCOUNTER — Emergency Department (INDEPENDENT_AMBULATORY_CARE_PROVIDER_SITE_OTHER)
Admission: EM | Admit: 2011-08-05 | Discharge: 2011-08-05 | Disposition: A | Discharge status: Home / Self Care | Payer: 59 | Source: Home / Self Care | Attending: Family Medicine | Admitting: Family Medicine

## 2011-08-05 DIAGNOSIS — T23279A Burn of second degree of unspecified wrist, initial encounter: Secondary | ICD-10-CM

## 2011-08-05 DIAGNOSIS — T23271A Burn of second degree of right wrist, initial encounter: Secondary | ICD-10-CM

## 2011-08-05 MED ORDER — HYDROCODONE-ACETAMINOPHEN 5-325 MG PO TABS: ORAL_TABLET | ORAL | Status: DC

## 2011-08-05 MED ORDER — SILVER SULFADIAZINE 1 % EX CREA
TOPICAL_CREAM | Freq: Once | CUTANEOUS | Status: AC
  Administered 2011-08-05: 17:00:00 via TOPICAL

## 2011-08-05 MED ORDER — SILVER SULFADIAZINE 1 % EX CREA: TOPICAL_CREAM | Freq: Two times a day (BID) | CUTANEOUS | Status: AC
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
## 2011-08-08 ENCOUNTER — Encounter: Payer: Self-pay | Admitting: Family Medicine

## 2011-08-08 ENCOUNTER — Ambulatory Visit (INDEPENDENT_AMBULATORY_CARE_PROVIDER_SITE_OTHER): Payer: 59 | Admitting: Family Medicine

## 2011-08-08 DIAGNOSIS — T2220XA Burn of second degree of shoulder and upper limb, except wrist and hand, unspecified site, initial encounter: Secondary | ICD-10-CM
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
## 2011-08-08 NOTE — Patient Instructions (Signed)
Change the dressings twice daily  Return when necessary

## 2011-08-28 ENCOUNTER — Encounter: Payer: Self-pay | Admitting: Family Medicine

## 2011-08-28 ENCOUNTER — Ambulatory Visit (INDEPENDENT_AMBULATORY_CARE_PROVIDER_SITE_OTHER): Payer: 59 | Admitting: Family Medicine

## 2011-08-28 DIAGNOSIS — M545 Low back pain: Secondary | ICD-10-CM | POA: Insufficient documentation

## 2011-08-28 DIAGNOSIS — M25511 Pain in right shoulder: Secondary | ICD-10-CM

## 2011-08-28 DIAGNOSIS — M25519 Pain in unspecified shoulder: Secondary | ICD-10-CM

## 2011-08-28 MED ORDER — PREDNISONE 20 MG PO TABS: ORAL_TABLET | ORAL | Status: DC

## 2011-08-28 MED ORDER — TRAMADOL HCL 50 MG PO TABS: ORAL_TABLET | ORAL | Status: DC

## 2011-08-28 NOTE — Progress Notes (Signed)
  Subjective:    Patient ID: ISAURA SCHILLER, female    DOB: 01-02-1956, 56 y.o.   MRN: [id number]  HPI Chantrice is a 56 year old female who comes in today for evaluation of 2 problems  For the past 2 weeks she's had pain in her right shoulder no history of trauma. At one time she had an injection of cortisone in that shoulder for severe pain.  She also complains of lumbar back pain. She points to her coccyx as a source of her discomfort. No history of trauma. No history of a cyst.   Review of Systems    general and orthopedic review of systems otherwise negative Objective:   Physical Exam Well-developed well-nourished female in no acute distress examination of right shoulder shows decreased external range of motion.  The spine appears normal palpation normal except for some tenderness right in the coccygeal area no cyst is palpated       Assessment & Plan:  Right shoulder pain  Low back pain  Plan prednisone burst and taper return when necessary

## 2011-08-28 NOTE — Patient Instructions (Signed)
Take the prednisone as directed  Tramadol one half to one tablet at bedtime when necessary for back and shoulder pain  If the pain does not resolve then I would recommend you consult with orthopedist Dr. Albertha Ghee

## 2011-10-30 ENCOUNTER — Ambulatory Visit (INDEPENDENT_AMBULATORY_CARE_PROVIDER_SITE_OTHER): Payer: PRIVATE HEALTH INSURANCE | Admitting: Family

## 2011-10-30 ENCOUNTER — Encounter: Payer: Self-pay | Admitting: Family

## 2011-10-30 VITALS — BP 120/60 | Temp 98.8°F | Wt 157.0 lb

## 2011-10-30 DIAGNOSIS — R3915 Urgency of urination: Secondary | ICD-10-CM

## 2011-10-30 DIAGNOSIS — N390 Urinary tract infection, site not specified: Secondary | ICD-10-CM

## 2011-10-30 LAB — POCT URINALYSIS DIPSTICK
Bilirubin, UA: NEGATIVE
Ketones, UA: NEGATIVE
pH, UA: 7

## 2011-10-30 MED ORDER — SULFAMETHOXAZOLE-TRIMETHOPRIM 800-160 MG PO TABS: 1.0000 | ORAL_TABLET | Freq: Two times a day (BID) | ORAL | Status: AC

## 2011-10-30 NOTE — Progress Notes (Signed)
  Subjective:    Patient ID: Tabitha Cherry, female    DOB: 1956/05/15, 56 y.o.   MRN: [id number]  HPI 56 year old American female, patient of Dr. Tawanna Cooler is in with complaints of urinary frequency, burning with urination, pelvic pressure, and urgency x 2 days. She denies any lightheadedness, dizziness, fevers, chills, aches or pain, chest pain, shortness of breath or edema.  Review of Systems  Constitutional: Negative.   Respiratory: Negative.   Cardiovascular: Negative.   Gastrointestinal: Negative.   Genitourinary: Positive for dysuria, urgency and frequency.  Neurological: Negative.   Hematological: Negative.   Psychiatric/Behavioral: Negative.    Past Medical History  Diagnosis Date  . ALLERGIC RHINITIS 03/13/2007  . GERD 03/13/2007  . Environmental allergies     History   Social History  . Marital Status: Married    Spouse Name: N/A    Number of Children: N/A  . Years of Education: N/A   Occupational History  . Not on file.   Social History Main Topics  . Smoking status: Never Smoker   . Smokeless tobacco: Not on file  . Alcohol Use: No  . Drug Use: No  . Sexually Active: Not on file   Other Topics Concern  . Not on file   Social History Narrative  . No narrative on file    Past Surgical History  Procedure Date  . Hemorroidectomy   . Dilation and curettage of uterus   . Abdominal surgery     Family History  Problem Relation Age of Onset  . Cancer Father     lung  . Diabetes Sister     No Known Allergies  Current Outpatient Prescriptions on File Prior to Visit  Medication Sig Dispense Refill  . aspirin 81 MG tablet Take 160 mg by mouth daily.        . Calcium Carbonate-Vitamin D (CALTRATE 600+D PO) Take by mouth.        . traMADol (ULTRAM) 50 MG tablet One half to one tablet at bedtime when necessary for pain  30 tablet  2  . predniSONE (DELTASONE) 20 MG tablet 1 tab x5 days, a half a tab x5 days, then a half a tablet Monday Wednesday  Friday for a three-week taper  30 tablet  1  . silver sulfADIAZINE (SILVADENE) 1 % cream Apply topically 2 (two) times daily.  50 g  0    BP 120/60  Temp(Src) 98.8 F (37.1 C) (Oral)  Wt 157 lb (71.215 kg)chart    Objective:   Physical Exam  Constitutional: She is oriented to person, place, and time. She appears well-developed and well-nourished.  Neck: Normal range of motion. Neck supple.  Cardiovascular: Normal rate, regular rhythm and normal heart sounds.   Pulmonary/Chest: Effort normal and breath sounds normal.  Abdominal: Soft. Bowel sounds are normal.  Musculoskeletal: Normal range of motion.  Neurological: She is alert and oriented to person, place, and time.  Skin: Skin is warm and dry.  Psychiatric: She has a normal mood and affect.          Assessment & Plan:  Assessment: Urinary frequency, UTI  Plan: Bactrim DS 1 tablet twice a day x7 days. Drink plenty of fluids. Void after intercourse. Patient to call the office if symptoms worsen or persist, recheck as scheduled and when necessary.

## 2011-10-30 NOTE — Patient Instructions (Signed)

## 2012-09-10 ENCOUNTER — Encounter (HOSPITAL_COMMUNITY): Payer: Self-pay | Admitting: Emergency Medicine

## 2012-09-10 ENCOUNTER — Emergency Department (HOSPITAL_COMMUNITY)
Admission: EM | Admit: 2012-09-10 | Discharge: 2012-09-10 | Disposition: A | Discharge status: Home / Self Care | Payer: PRIVATE HEALTH INSURANCE | Source: Home / Self Care | Attending: Emergency Medicine | Admitting: Emergency Medicine

## 2012-09-10 DIAGNOSIS — S058X9A Other injuries of unspecified eye and orbit, initial encounter: Secondary | ICD-10-CM

## 2012-09-10 MED ORDER — TOBRAMYCIN 0.3 % OP SOLN: 1.0000 [drp] | OPHTHALMIC | Status: DC

## 2012-09-10 MED ORDER — HYDROCODONE-ACETAMINOPHEN 5-325 MG PO TABS: 2.0000 | ORAL_TABLET | ORAL | Status: DC | PRN
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
## 2012-09-10 NOTE — ED Provider Notes (Signed)
Medical screening examination/treatment/procedure(s) were performed by non-physician practitioner and as supervising physician I was immediately available for consultation/collaboration.  Leslee Home, M.D.  Reuben Likes, MD 09/10/12 2136
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
## 2013-02-26 ENCOUNTER — Ambulatory Visit: Payer: Self-pay | Admitting: Podiatry

## 2013-03-26 ENCOUNTER — Encounter: Payer: Self-pay | Admitting: Podiatry

## 2013-03-26 ENCOUNTER — Ambulatory Visit: Payer: Self-pay | Admitting: Podiatry

## 2013-03-26 ENCOUNTER — Ambulatory Visit (INDEPENDENT_AMBULATORY_CARE_PROVIDER_SITE_OTHER): Payer: PRIVATE HEALTH INSURANCE | Admitting: Podiatry

## 2013-03-26 VITALS — BP 130/74 | HR 70 | Ht 64.0 in | Wt 151.0 lb

## 2013-03-26 DIAGNOSIS — B351 Tinea unguium: Secondary | ICD-10-CM

## 2013-03-26 DIAGNOSIS — M79675 Pain in left toe(s): Secondary | ICD-10-CM | POA: Insufficient documentation

## 2013-03-26 DIAGNOSIS — M79609 Pain in unspecified limb: Secondary | ICD-10-CM

## 2013-03-26 MED ORDER — EFINACONAZOLE 10 % EX SOLN: 1.0000 [drp] | Freq: Every morning | CUTANEOUS | Status: DC

## 2013-03-26 NOTE — Progress Notes (Signed)
Part of toe nail came off and other nail has black stuff.  History of left 3rd MPJ area surgery in 2001.n Has right shoulder bursitis at times.  Review of Systems - General ROS: negative for - fever, hot flashes, night sweats, sleep disturbance, weight gain or weight loss Ophthalmic ROS: Only with Hayfever and allergies. ENT ROS: negative Allergy and Immunology ROS: Hayfever. Endocrine ROS: negative Respiratory ROS: no cough, shortness of breath, or wheezing Cardiovascular ROS: no chest pain or dyspnea on exertion Gastrointestinal ROS: no abdominal pain, change in bowel habits, or black or bloody stools Musculoskeletal ROS: negative Neurological ROS: no TIA or stroke symptoms Dermatological ROS: Only abnormal nail plate.  Objective: Dermatologic: Broken nail in half horizontal  Black line on right great toe covered with nail polish. Plantar callus under 5th MPJ left. Vascular: All pedal pulses palpable. Orthopedic:  Bilateral bunion. Neurologic: All epicritic and tactile sensations grossly intact.  Assessment: Traumatized fungal nails both great toes. All debrided. Rx. Jublia with coupon.

## 2013-03-26 NOTE — Patient Instructions (Addendum)
Seen for deformed and discolored nails. Nails have been injured and has fungus at discolored area. May use antifungal medication for nails. Coupon dispensed.

## 2013-03-28 ENCOUNTER — Other Ambulatory Visit: Payer: Self-pay | Admitting: Podiatry

## 2013-03-28 MED ORDER — ITRACONAZOLE 100 MG PO CAPS: 200.0000 mg | ORAL_CAPSULE | Freq: Every day | ORAL | Status: DC

## 2013-04-30 ENCOUNTER — Encounter: Payer: Self-pay | Admitting: Podiatry

## 2013-04-30 ENCOUNTER — Ambulatory Visit (INDEPENDENT_AMBULATORY_CARE_PROVIDER_SITE_OTHER): Payer: PRIVATE HEALTH INSURANCE | Admitting: Podiatry

## 2013-04-30 VITALS — BP 132/87 | HR 62 | Ht 64.0 in | Wt 153.0 lb

## 2013-04-30 DIAGNOSIS — B351 Tinea unguium: Secondary | ICD-10-CM

## 2013-04-30 NOTE — Progress Notes (Signed)
Patient took Sporanox for about a month. They made her fingers numb. She discontinued.  Objective: Nails appear the same with some fungal infection on medial borders of great toes. No new findings. Normal neurovascular status.  Assessment: Mild fungal infection great toe nails.  Plan: Compounding topical medication prescribed.

## 2013-04-30 NOTE — Patient Instructions (Signed)
Prescribing compounding medication for fungal nail. Use as directed and return in 2 months.

## 2013-05-21 ENCOUNTER — Other Ambulatory Visit: Payer: Self-pay

## 2013-05-21 DIAGNOSIS — Z1231 Encounter for screening mammogram for malignant neoplasm of breast: Secondary | ICD-10-CM

## 2013-06-24 ENCOUNTER — Ambulatory Visit
Admission: RE | Admit: 2013-06-24 | Discharge: 2013-06-24 | Disposition: A | Discharge status: Home / Self Care | Payer: PRIVATE HEALTH INSURANCE | Source: Ambulatory Visit

## 2013-06-24 DIAGNOSIS — Z1231 Encounter for screening mammogram for malignant neoplasm of breast: Secondary | ICD-10-CM

## 2013-06-25 ENCOUNTER — Ambulatory Visit: Payer: PRIVATE HEALTH INSURANCE | Admitting: Podiatry

## 2014-03-20 ENCOUNTER — Encounter: Payer: Self-pay | Admitting: Internal Medicine

## 2014-08-27 ENCOUNTER — Other Ambulatory Visit: Payer: Self-pay

## 2014-08-27 DIAGNOSIS — Z1231 Encounter for screening mammogram for malignant neoplasm of breast: Secondary | ICD-10-CM

## 2014-09-01 ENCOUNTER — Ambulatory Visit
Admission: RE | Admit: 2014-09-01 | Discharge: 2014-09-01 | Disposition: A | Discharge status: Home / Self Care | Payer: PRIVATE HEALTH INSURANCE | Source: Ambulatory Visit

## 2014-09-01 DIAGNOSIS — Z1231 Encounter for screening mammogram for malignant neoplasm of breast: Secondary | ICD-10-CM

## 2017-01-25 ENCOUNTER — Other Ambulatory Visit: Payer: Self-pay | Admitting: Obstetrics and Gynecology

## 2017-01-25 DIAGNOSIS — Z1231 Encounter for screening mammogram for malignant neoplasm of breast: Secondary | ICD-10-CM

## 2017-02-01 ENCOUNTER — Encounter (INDEPENDENT_AMBULATORY_CARE_PROVIDER_SITE_OTHER): Payer: Self-pay

## 2017-02-01 ENCOUNTER — Ambulatory Visit
Admission: RE | Admit: 2017-02-01 | Discharge: 2017-02-01 | Disposition: A | Discharge status: Home / Self Care | Payer: BLUE CROSS/BLUE SHIELD | Source: Ambulatory Visit | Attending: Obstetrics and Gynecology | Admitting: Obstetrics and Gynecology

## 2017-02-01 DIAGNOSIS — Z1231 Encounter for screening mammogram for malignant neoplasm of breast: Secondary | ICD-10-CM

## 2018-03-05 ENCOUNTER — Encounter: Payer: Self-pay | Admitting: Allergy and Immunology

## 2018-03-05 ENCOUNTER — Ambulatory Visit: Payer: PRIVATE HEALTH INSURANCE | Admitting: Allergy and Immunology

## 2018-03-05 ENCOUNTER — Encounter (INDEPENDENT_AMBULATORY_CARE_PROVIDER_SITE_OTHER): Payer: Self-pay

## 2018-03-05 VITALS — BP 120/74 | HR 77 | Resp 16 | Ht 64.0 in | Wt 150.8 lb

## 2018-03-05 DIAGNOSIS — T63441A Toxic effect of venom of bees, accidental (unintentional), initial encounter: Secondary | ICD-10-CM | POA: Diagnosis not present

## 2018-03-05 NOTE — Progress Notes (Signed)
NEW PATIENT NOTE  Referring Provider: No ref. provider found Primary Provider: Patient, No Pcp Per Date of office visit: 03/05/2018    Subjective:   Chief Complaint:  Tabitha Cherry (DOB: Oct 01, 1955) is a 62 y.o. female who presents to the clinic on 03/05/2018 with a chief complaint of Insect Bite (hornets, red rings, dark rings, L thigh ) .  HPI: Tabitha Cherry presents to this clinic in evaluation of hymenoptera venom reaction.  Apparently in early May she was stung by several ground dwelling flying insects.  She had 4 stings on her left thigh and several on her right arm.  She had a large local reaction that developed especially on her left thigh over the course of a week that started approximately 2 hours after inoculation.  She did not have any associated systemic or constitutional symptoms with this reaction.  Her large local reaction became quite thickened and red and changed colors and over the course of a month or so she was left with a very dark spot on her left thigh.  Even today this pigment change has not resolved and she has developed a achiness in that area.  She has noticed that when she runs her hand across her thigh it just feels a little bit sore even with light touch.  This is slowly improving but is still an issue that exist today.  Henryetta also has an issue with grass and dust allergy with the development of nasal congestion and sneezing but for the most part presents itself during the spring and fall and is under excellent control with the use of an over-the-counter antihistamine as needed.  She has no other associated atopic disease.  Past Medical History:  Diagnosis Date  . ALLERGIC RHINITIS 03/13/2007  . Environmental allergies   . GERD 03/13/2007    Past Surgical History:  Procedure Laterality Date  . ABDOMINAL SURGERY    . DILATION AND CURETTAGE OF UTERUS    . HEMORROIDECTOMY      Allergies as of 03/05/2018      Reactions   Dust Mite Extract    Mold Extract [trichophyton]    Sporanox [itraconazole] Swelling   Swelling and pain in toes and fingers      Medication List      ferrous sulfate 325 (65 FE) MG tablet Take 325 mg by mouth daily with breakfast.       Review of systems negative except as noted in HPI / PMHx or noted below:  Review of Systems  Constitutional: Negative.   HENT: Negative.   Eyes: Negative.   Respiratory: Negative.   Cardiovascular: Negative.   Gastrointestinal: Negative.   Genitourinary: Negative.   Musculoskeletal: Negative.   Skin: Negative.   Neurological: Negative.   Endo/Heme/Allergies: Negative.   Psychiatric/Behavioral: Negative.     Family History  Problem Relation Age of Onset  . Cancer Father        lung  . Diabetes Sister   . Breast cancer Neg Hx     Social History   Socioeconomic History  . Marital status: Married    Spouse name: Not on file  . Number of children: Not on file  . Years of education: Not on file  . Highest education level: Not on file  Occupational History  . Not on file  Social Needs  . Financial resource strain: Not on file  . Food insecurity:    Worry: Not on file    Inability: Not on file  .  Transportation needs:    Medical: Not on file    Non-medical: Not on file  Tobacco Use  . Smoking status: Never Smoker  . Smokeless tobacco: Never Used  Substance and Sexual Activity  . Alcohol use: No  . Drug use: No  . Sexual activity: Not on file  Lifestyle  . Physical activity:    Days per week: Not on file    Minutes per session: Not on file  . Stress: Not on file  Relationships  . Social connections:    Talks on phone: Not on file    Gets together: Not on file    Attends religious service: Not on file    Active member of club or organization: Not on file    Attends meetings of clubs or organizations: Not on file    Relationship status: Not on file  . Intimate partner violence:    Fear of current or ex partner: Not on file     Emotionally abused: Not on file    Physically abused: Not on file    Forced sexual activity: Not on file  Other Topics Concern  . Not on file  Social History Narrative  . Not on file    Environmental and Social history  Lives in a house with a dry environment, no animals located inside the household, no carpet in the bedroom, no plastic on the bed, no plastic on the pillow, no smokers located inside the household.  Objective:   Vitals:   03/05/18 0926 03/05/18 0952  BP: (!) 160/90 120/74  Pulse: 77   Resp: 16   SpO2: 99%    Height: 5\' 4"  (162.6 cm) Weight: 150 lb 12.8 oz (68.4 kg)  Physical Exam  HENT:  Head: Normocephalic. Head is without right periorbital erythema and without left periorbital erythema.  Right Ear: Tympanic membrane, external ear and ear canal normal.  Left Ear: Tympanic membrane, external ear and ear canal normal.  Nose: Nose normal. No mucosal edema or rhinorrhea.  Mouth/Throat: Uvula is midline, oropharynx is clear and moist and mucous membranes are normal. No oropharyngeal exudate.  Eyes: Pupils are equal, round, and reactive to light. Conjunctivae and lids are normal.  Neck: Trachea normal. No tracheal tenderness present. No tracheal deviation present. No thyromegaly present.  Cardiovascular: Normal rate, regular rhythm, S1 normal, S2 normal and normal heart sounds.  No murmur heard. Pulmonary/Chest: Effort normal and breath sounds normal. No stridor. No respiratory distress. She has no wheezes. She has no rales. She exhibits no tenderness.  Abdominal: Soft. She exhibits no distension and no mass. There is no hepatosplenomegaly. There is no tenderness. There is no rebound and no guarding.  Musculoskeletal: She exhibits no edema or tenderness.  Lymphadenopathy:       Head (right side): No tonsillar adenopathy present.       Head (left side): No tonsillar adenopathy present.    She has no cervical adenopathy.    She has no axillary adenopathy.    Neurological: She is alert.  Skin: No rash (Diffusely hyperpigmented oval-shaped macule approximately 2 x 4 cm diameter left anterior thigh without tenderness or induration) noted. She is not diaphoretic. No erythema. No pallor. Nails show no clubbing.    Diagnostics: Allergy skin tests were not performed.  Assessment and Plan:    1. Bee sting reaction, accidental or unintentional, initial encounter    Judeth CornfieldStephanie had a large local reaction to hymenoptera venom exposure without associated systemic or constitutional symptoms and thus is not  a candidate for further evaluation for hymenoptera allergy and does not need to carry an EpiPen as she is not at increased risk above the general population for the development of a systemic reaction based upon her initial reaction.  She appears to have a postinflammatory hyperpigmented area most likely with cutaneous nerve damage giving rise to her sensation of ache and tactile soreness.  There does not appear to be any active inflammation at this point in time.  There is no specific therapy that should be administered to address this issue at this point as she is slowly improving regarding this issue and I will assume that her hyperpigmentation and her apparent localized cutaneous neuropathy will resolve as time moves forward.  If she develops a another large local reaction in the future it would probably be best to treat her with low-dose systemic steroids in the hope of preventing her from developing such a significant inflammatory reaction.  Laurette Schimke, MD Allergy / Immunology [facility]

## 2018-03-06 ENCOUNTER — Encounter: Payer: Self-pay | Admitting: Allergy and Immunology

## 2018-03-14 ENCOUNTER — Other Ambulatory Visit: Payer: Self-pay | Admitting: Obstetrics and Gynecology

## 2018-03-14 DIAGNOSIS — Z1231 Encounter for screening mammogram for malignant neoplasm of breast: Secondary | ICD-10-CM

## 2018-04-10 ENCOUNTER — Ambulatory Visit: Payer: BLUE CROSS/BLUE SHIELD

## 2018-09-08 ENCOUNTER — Emergency Department (HOSPITAL_COMMUNITY)
Admission: EM | Admit: 2018-09-08 | Discharge: 2018-09-08 | Disposition: A | Discharge status: Home / Self Care | Payer: BLUE CROSS/BLUE SHIELD | Attending: Emergency Medicine | Admitting: Emergency Medicine

## 2018-09-08 ENCOUNTER — Other Ambulatory Visit: Payer: Self-pay

## 2018-09-08 ENCOUNTER — Encounter (HOSPITAL_COMMUNITY): Payer: Self-pay | Admitting: *Deleted

## 2018-09-08 ENCOUNTER — Emergency Department (HOSPITAL_COMMUNITY): Payer: BLUE CROSS/BLUE SHIELD

## 2018-09-08 ENCOUNTER — Telehealth (HOSPITAL_COMMUNITY): Payer: Self-pay | Admitting: Emergency Medicine

## 2018-09-08 ENCOUNTER — Ambulatory Visit (HOSPITAL_COMMUNITY): Admission: EM | Admit: 2018-09-08 | Discharge: 2018-09-08 | Payer: BLUE CROSS/BLUE SHIELD | Source: Home / Self Care

## 2018-09-08 DIAGNOSIS — Y939 Activity, unspecified: Secondary | ICD-10-CM | POA: Diagnosis not present

## 2018-09-08 DIAGNOSIS — S0990XA Unspecified injury of head, initial encounter: Secondary | ICD-10-CM

## 2018-09-08 DIAGNOSIS — Y929 Unspecified place or not applicable: Secondary | ICD-10-CM | POA: Insufficient documentation

## 2018-09-08 DIAGNOSIS — W208XXA Other cause of strike by thrown, projected or falling object, initial encounter: Secondary | ICD-10-CM | POA: Insufficient documentation

## 2018-09-08 DIAGNOSIS — R51 Headache: Secondary | ICD-10-CM | POA: Diagnosis not present

## 2018-09-08 DIAGNOSIS — Z79899 Other long term (current) drug therapy: Secondary | ICD-10-CM | POA: Insufficient documentation

## 2018-09-08 DIAGNOSIS — Y999 Unspecified external cause status: Secondary | ICD-10-CM | POA: Diagnosis not present

## 2018-09-08 DIAGNOSIS — S0012XA Contusion of left eyelid and periocular area, initial encounter: Secondary | ICD-10-CM | POA: Diagnosis not present

## 2018-09-08 DIAGNOSIS — S0512XA Contusion of eyeball and orbital tissues, left eye, initial encounter: Secondary | ICD-10-CM

## 2018-09-08 NOTE — ED Notes (Signed)
Patient transported to CT 

## 2018-09-08 NOTE — Discharge Instructions (Signed)
Take Tylenol as needed as directed for headache. Apply cold compress to your face for pain and swelling. Recheck with your doctor, return to ER for worsening or concerning symptoms.
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
## 2018-10-04 ENCOUNTER — Telehealth: Payer: Self-pay | Admitting: General Practice

## 2018-10-04 NOTE — Telephone Encounter (Signed)
Pt. Calling with COVID 19 questions. Has a cough. Will self monitor and quarantine. Instructed to call for further questions.

## 2019-05-08 IMAGING — MG DIGITAL SCREENING BILATERAL MAMMOGRAM WITH CAD
4 series · 4 of 4 positions shown · non-contrast
Comparison: Previous exam(s).

CLINICAL DATA: Screening.

EXAM:
DIGITAL SCREENING BILATERAL MAMMOGRAM WITH CAD

[R MLO]
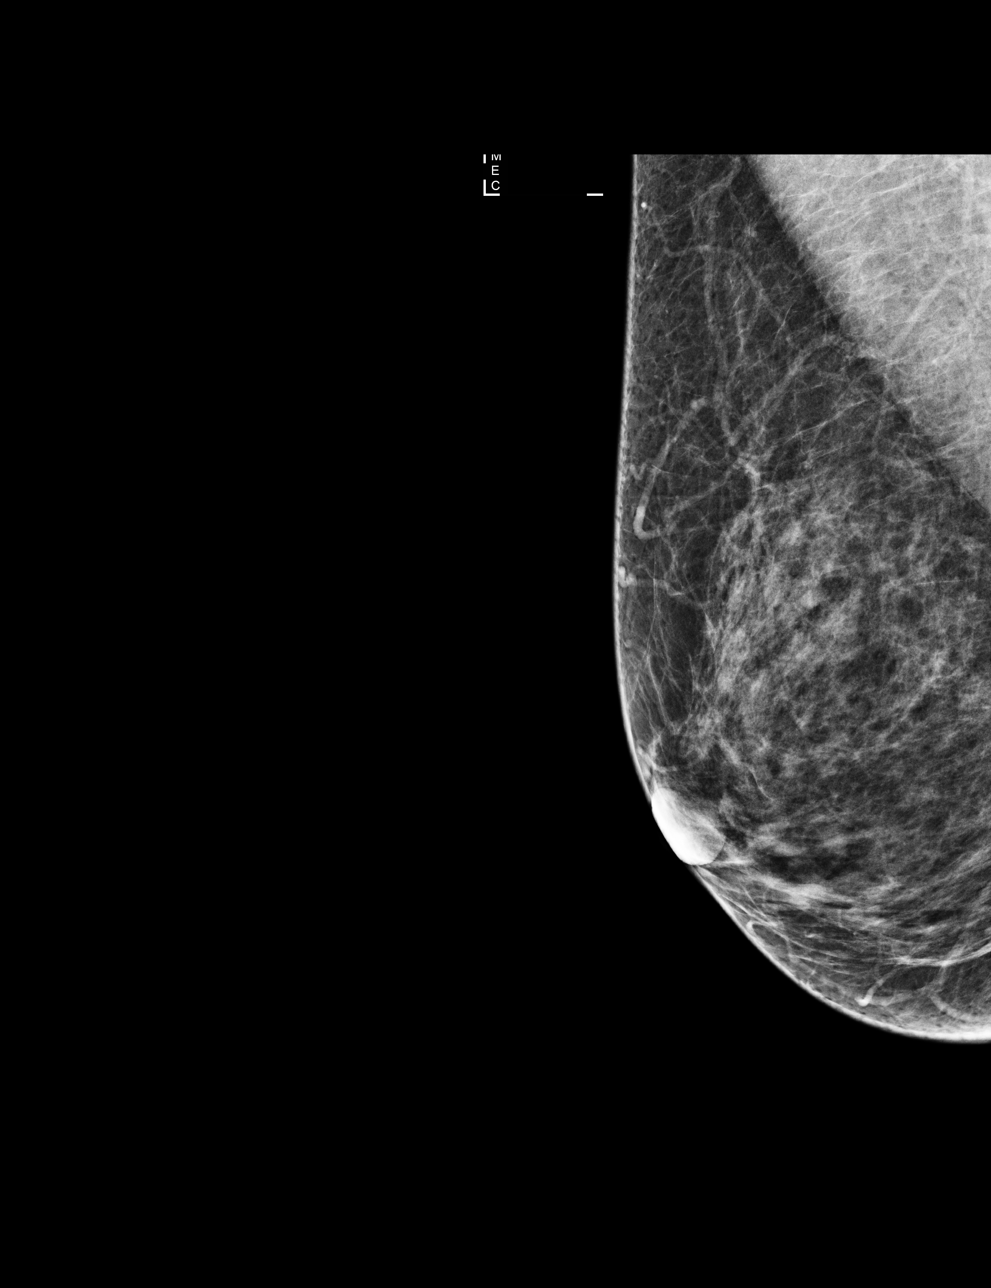

[L MLO]
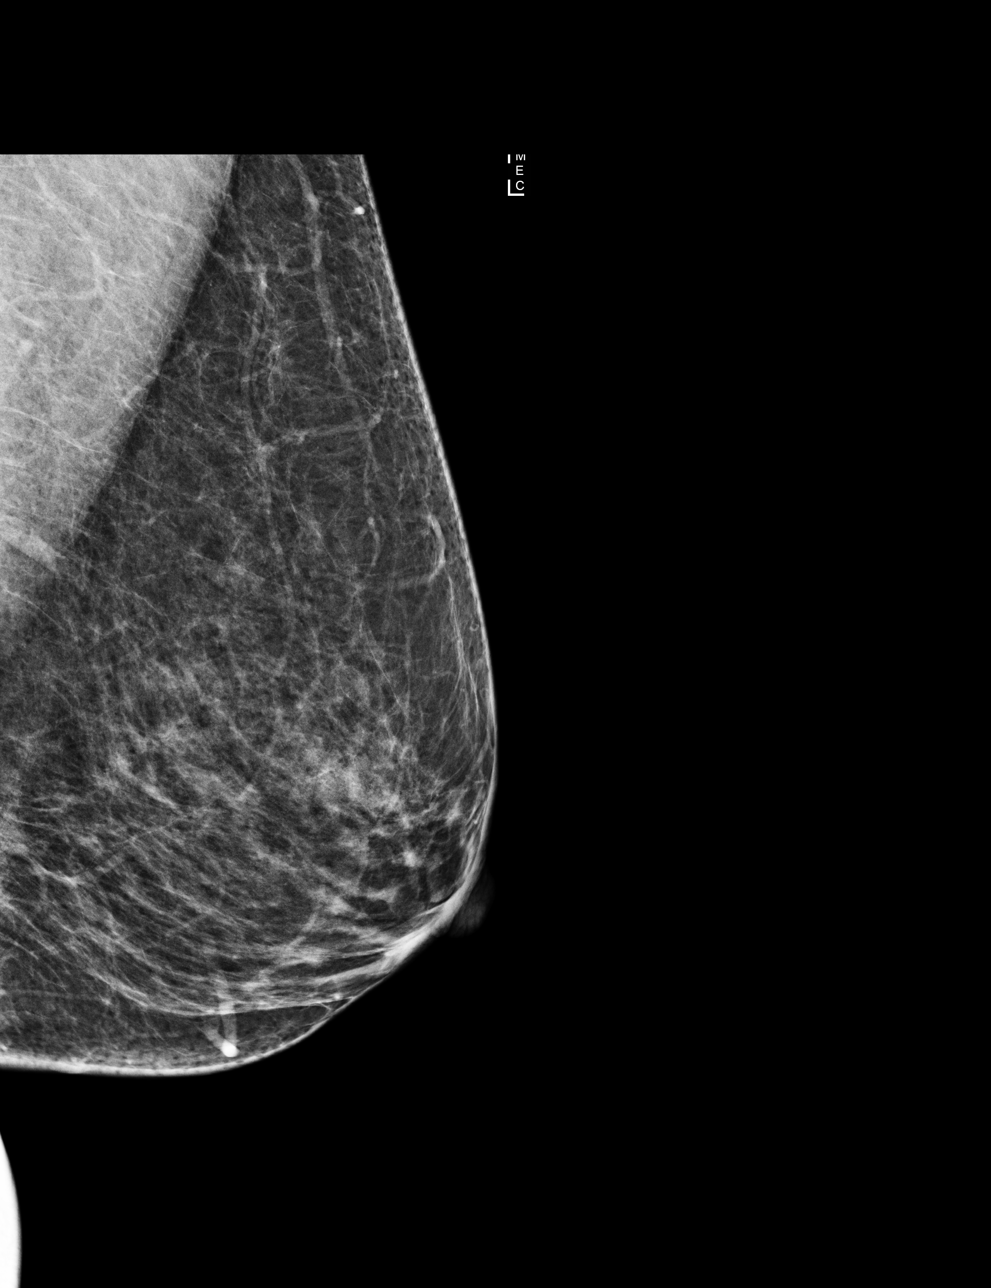

[L CC]
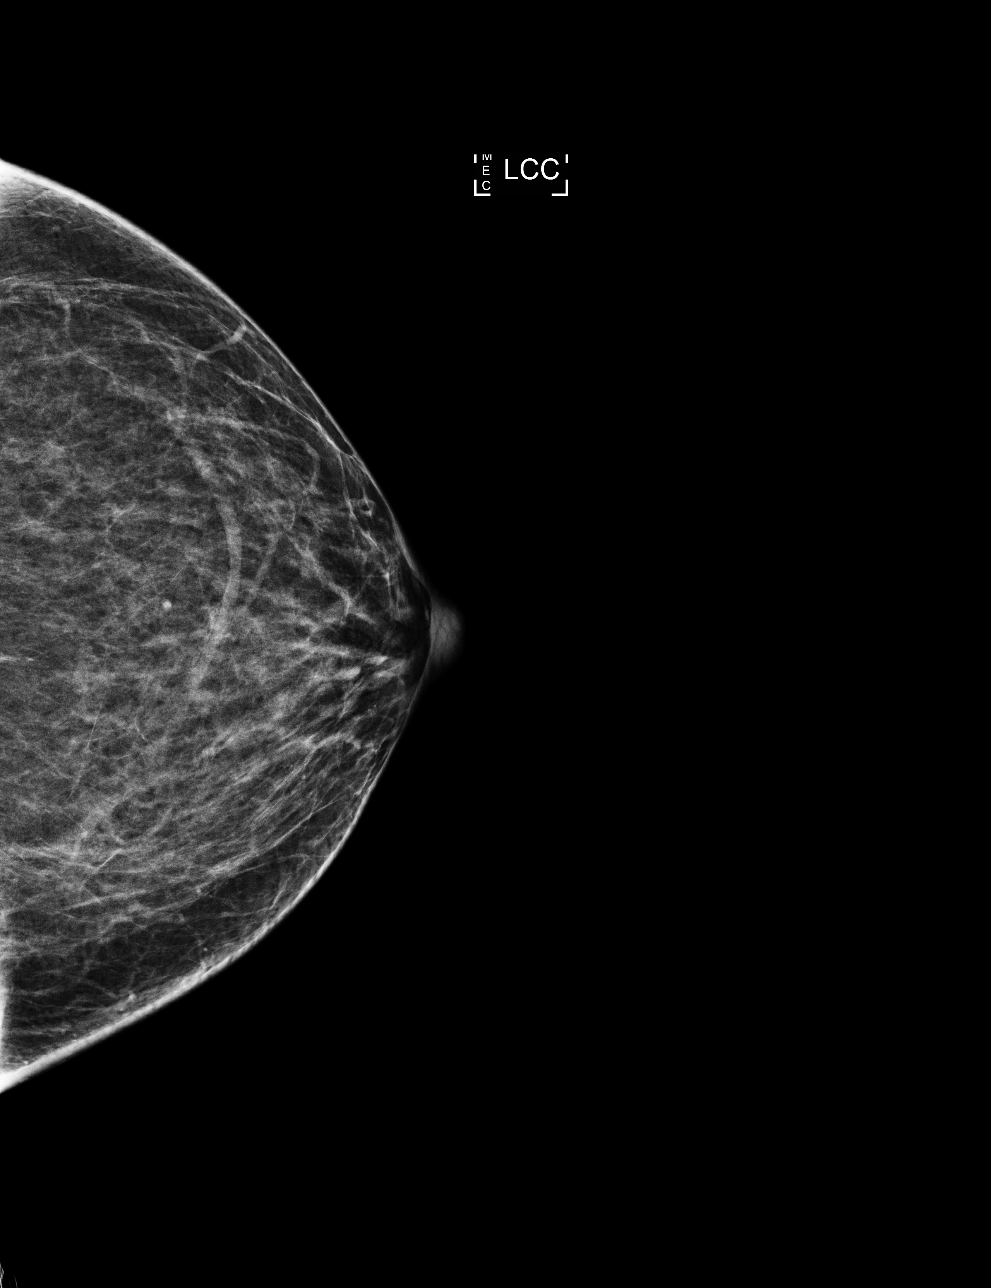

[R CC]
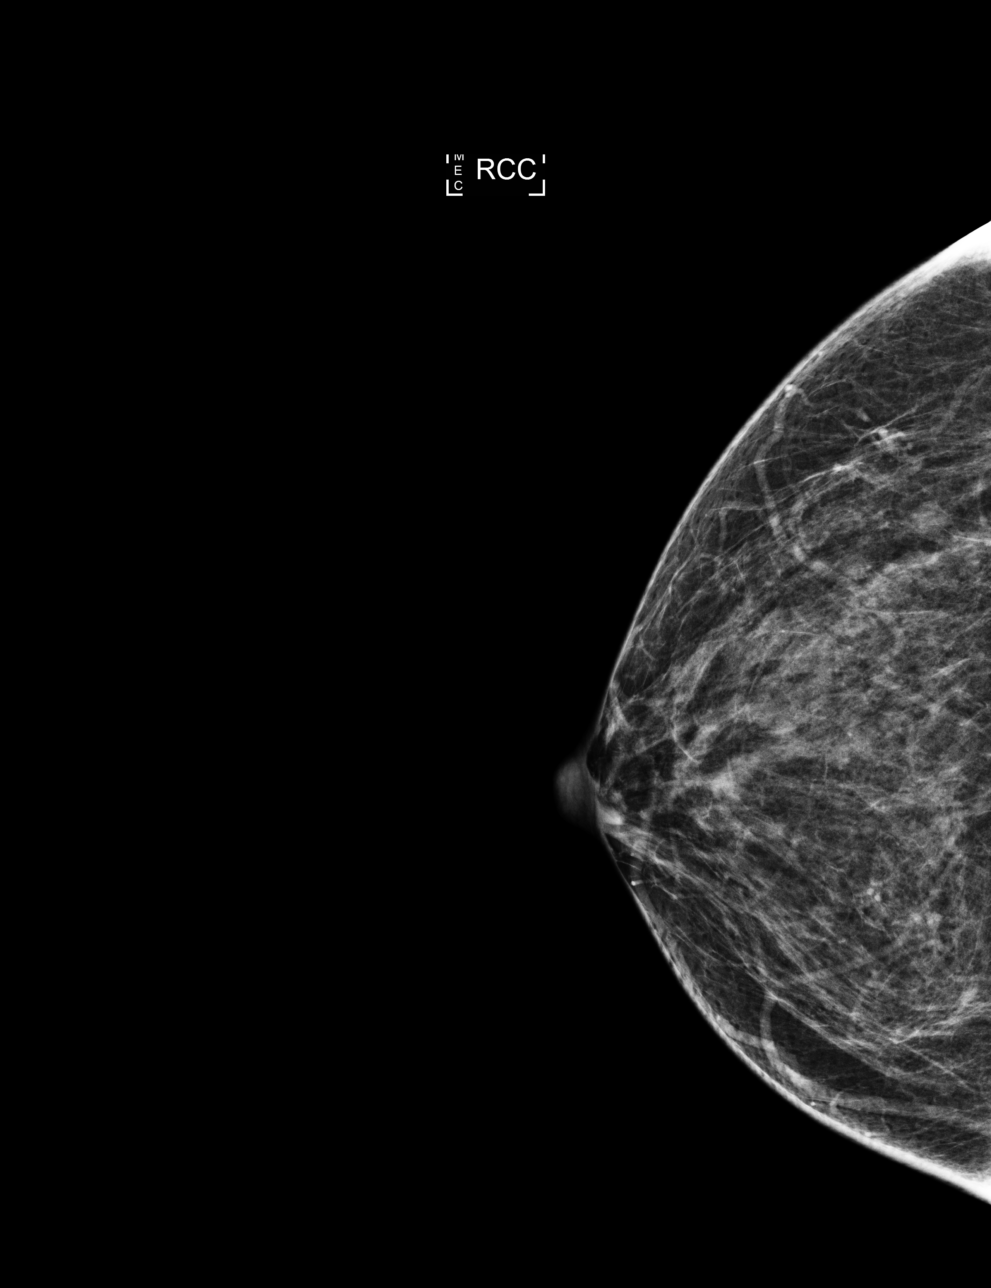

[4 of 4 positions shown; findings below may reference images not displayed]

ACR Breast Density Category b: There are scattered areas of
fibroglandular density.
FINDINGS: There are no findings suspicious for malignancy. Images were
processed with CAD.
IMPRESSION: No mammographic evidence of malignancy. A result letter of this
screening mammogram will be mailed directly to the patient.

RECOMMENDATION:
Screening mammogram in one year. (Code:AS-G-LCT)

BI-RADS CATEGORY  1: Negative.

## 2023-08-06 DIAGNOSIS — Z1231 Encounter for screening mammogram for malignant neoplasm of breast: Secondary | ICD-10-CM | POA: Diagnosis not present

## 2023-08-13 ENCOUNTER — Telehealth: Payer: Self-pay | Admitting: General Practice

## 2023-08-13 NOTE — Telephone Encounter (Unsigned)
Copied from CRM [id number]. Topic: Clinical - Request for Lab/Test Order >> Aug 13, 2023 10:50 AM Gildardo Pounds wrote: Reason for CRM: Deanna, [facility] states patient was called back for additional testing. needs orders for left diagnostic mammogram and left breast ultrasound. Callback number [phone]

## 2023-08-21 DIAGNOSIS — R928 Other abnormal and inconclusive findings on diagnostic imaging of breast: Secondary | ICD-10-CM | POA: Diagnosis not present

## 2023-08-21 DIAGNOSIS — R92322 Mammographic fibroglandular density, left breast: Secondary | ICD-10-CM | POA: Diagnosis not present

## 2023-08-27 DIAGNOSIS — H401133 Primary open-angle glaucoma, bilateral, severe stage: Secondary | ICD-10-CM | POA: Diagnosis not present

## 2023-08-27 DIAGNOSIS — H2513 Age-related nuclear cataract, bilateral: Secondary | ICD-10-CM | POA: Diagnosis not present

## 2023-08-27 DIAGNOSIS — H1045 Other chronic allergic conjunctivitis: Secondary | ICD-10-CM | POA: Diagnosis not present

## 2023-08-27 DIAGNOSIS — H04123 Dry eye syndrome of bilateral lacrimal glands: Secondary | ICD-10-CM | POA: Diagnosis not present

## 2023-11-28 DIAGNOSIS — S50861A Insect bite (nonvenomous) of right forearm, initial encounter: Secondary | ICD-10-CM | POA: Diagnosis not present

## 2023-11-28 DIAGNOSIS — M546 Pain in thoracic spine: Secondary | ICD-10-CM | POA: Diagnosis not present

## 2023-11-28 DIAGNOSIS — W57XXXA Bitten or stung by nonvenomous insect and other nonvenomous arthropods, initial encounter: Secondary | ICD-10-CM | POA: Diagnosis not present

## 2023-12-20 DIAGNOSIS — M549 Dorsalgia, unspecified: Secondary | ICD-10-CM | POA: Diagnosis not present

## 2023-12-24 DIAGNOSIS — H31101 Choroidal degeneration, unspecified, right eye: Secondary | ICD-10-CM | POA: Diagnosis not present

## 2023-12-24 DIAGNOSIS — H2513 Age-related nuclear cataract, bilateral: Secondary | ICD-10-CM | POA: Diagnosis not present

## 2023-12-24 DIAGNOSIS — H401133 Primary open-angle glaucoma, bilateral, severe stage: Secondary | ICD-10-CM | POA: Diagnosis not present

## 2023-12-24 DIAGNOSIS — H04123 Dry eye syndrome of bilateral lacrimal glands: Secondary | ICD-10-CM | POA: Diagnosis not present

## 2023-12-24 DIAGNOSIS — H1045 Other chronic allergic conjunctivitis: Secondary | ICD-10-CM | POA: Diagnosis not present

## 2024-02-29 DIAGNOSIS — R928 Other abnormal and inconclusive findings on diagnostic imaging of breast: Secondary | ICD-10-CM | POA: Diagnosis not present

## 2024-02-29 DIAGNOSIS — R92322 Mammographic fibroglandular density, left breast: Secondary | ICD-10-CM | POA: Diagnosis not present

## 2024-04-30 DIAGNOSIS — H2513 Age-related nuclear cataract, bilateral: Secondary | ICD-10-CM | POA: Diagnosis not present

## 2024-04-30 DIAGNOSIS — H1045 Other chronic allergic conjunctivitis: Secondary | ICD-10-CM | POA: Diagnosis not present

## 2024-04-30 DIAGNOSIS — H04123 Dry eye syndrome of bilateral lacrimal glands: Secondary | ICD-10-CM | POA: Diagnosis not present

## 2024-04-30 DIAGNOSIS — H401133 Primary open-angle glaucoma, bilateral, severe stage: Secondary | ICD-10-CM | POA: Diagnosis not present
